# Patient Record
Sex: Female | Born: 1995 | Race: Black or African American | Hispanic: No | Marital: Single | Smoking: Current some day smoker
Health system: Southern US, Community
[De-identification: ages and names within clinical notes are randomized; demographics above are authoritative.]

## PROBLEM LIST (undated history)

## (undated) DIAGNOSIS — G43909 Migraine, unspecified, not intractable, without status migrainosus: Secondary | ICD-10-CM

## (undated) HISTORY — DX: Migraine, unspecified, not intractable, without status migrainosus: G43.909

## (undated) HISTORY — PX: NO PAST SURGERIES: SHX2092

---
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
## 2017-11-19 DIAGNOSIS — J111 Influenza due to unidentified influenza virus with other respiratory manifestations: Secondary | ICD-10-CM | POA: Diagnosis not present

## 2017-12-26 DIAGNOSIS — Z3042 Encounter for surveillance of injectable contraceptive: Secondary | ICD-10-CM | POA: Diagnosis not present

## 2018-02-25 DIAGNOSIS — N390 Urinary tract infection, site not specified: Secondary | ICD-10-CM | POA: Diagnosis not present

## 2018-02-25 DIAGNOSIS — N3000 Acute cystitis without hematuria: Secondary | ICD-10-CM | POA: Diagnosis not present

## 2018-02-25 DIAGNOSIS — J029 Acute pharyngitis, unspecified: Secondary | ICD-10-CM | POA: Diagnosis not present

## 2018-03-17 IMAGING — CT CT ANGIO CHEST
2 of 6 series · 19 of 46 positions shown · IV contrast (isovue)
Comparison: None.

CLINICAL DATA: History of heart murmur, followup, recent
echocardiography, history of migraine

EXAM:
CT ANGIOGRAPHY CHEST WITH CONTRAST
TECHNIQUE: Multidetector CT imaging of the chest was performed using the
standard protocol during bolus administration of intravenous
contrast. Multiplanar CT image reconstructions and MIPs were
obtained to evaluate the vascular anatomy.
CONTRAST:  100 cc Isovue 370

[Series 5: axial arterial · axial · arterial · 0.55mm/px · z∈[-412,-157]mm · 16 of 97 slices shown]
[im 6/97  lung]
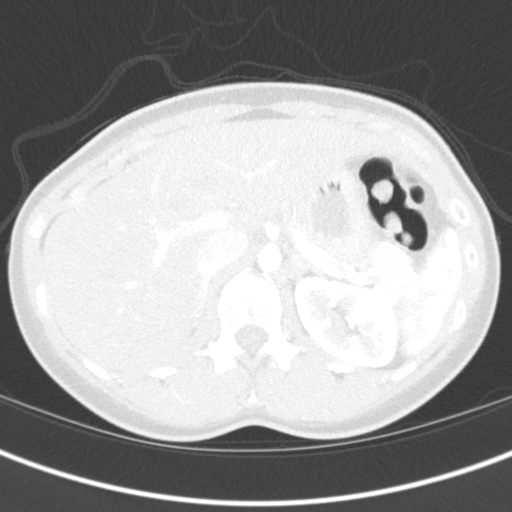
[im 11/97  soft-tissue]
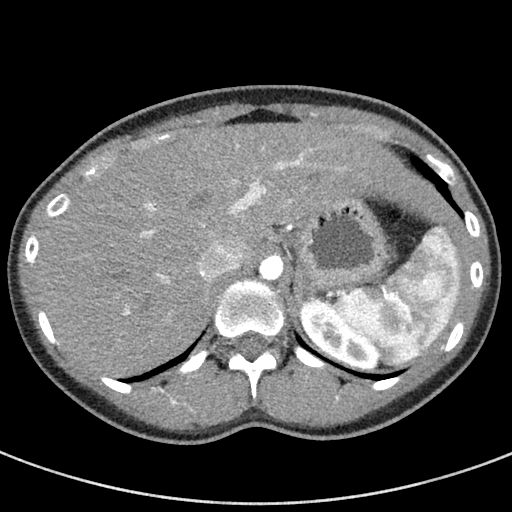
[im 16/97  lung]
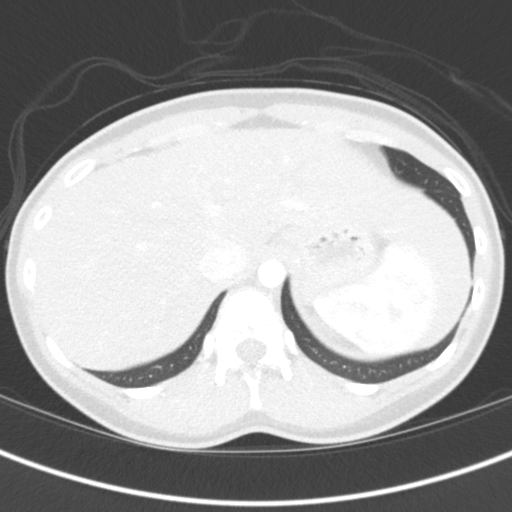
[im 21/97  soft-tissue]
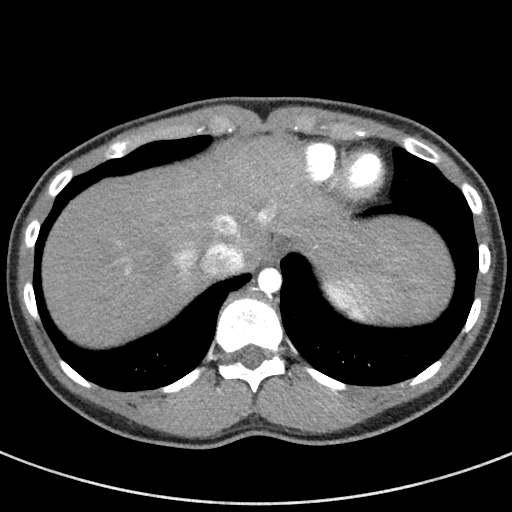
[im 31/97  lung]
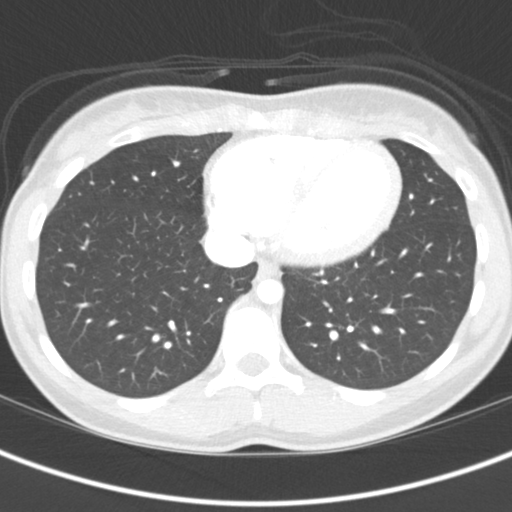
[im 36/97  soft-tissue]
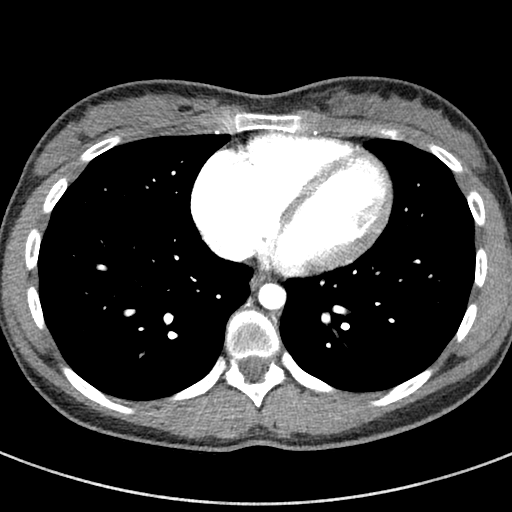
[im 41/97  lung]
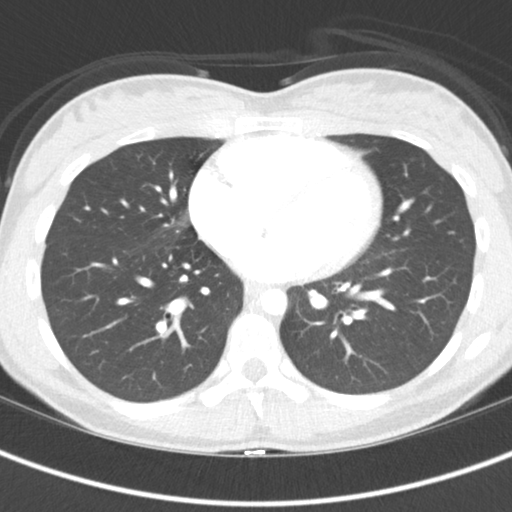
[im 46/97  soft-tissue]
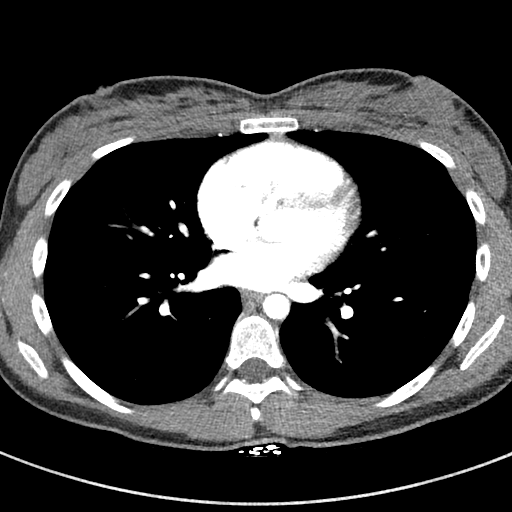
[im 51/97  lung]
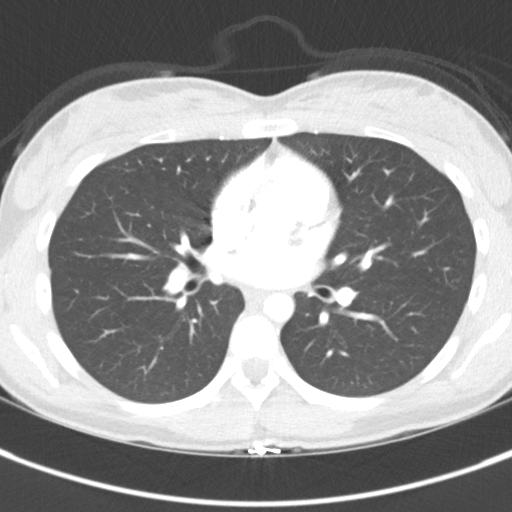
[im 56/97  soft-tissue]
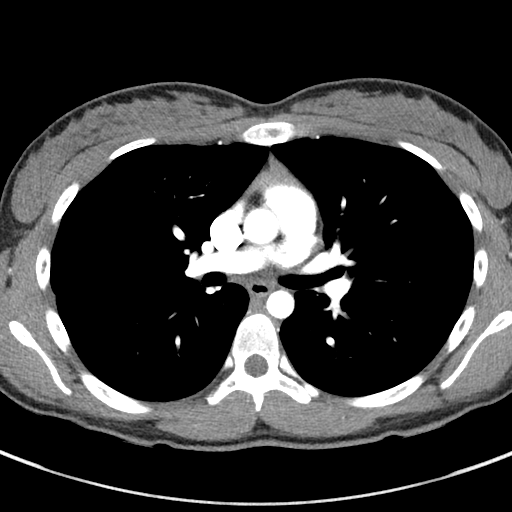
[im 61/97  lung]
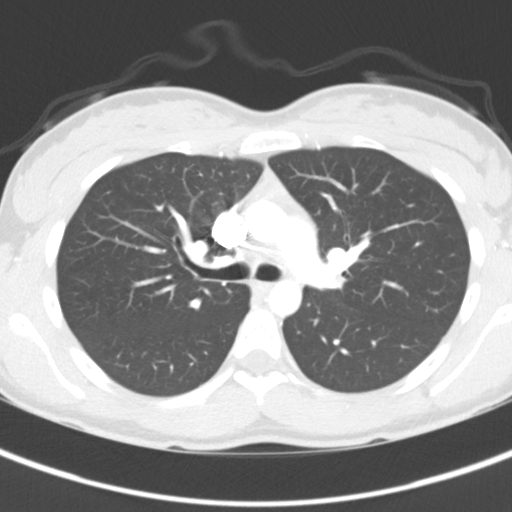
[im 66/97  soft-tissue]
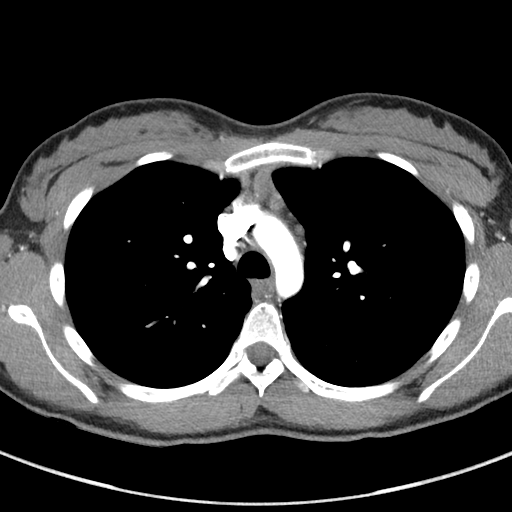
[im 76/97  lung]
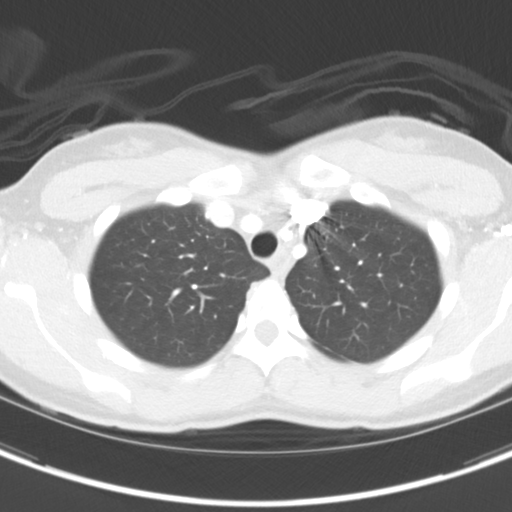
[im 81/97  soft-tissue]
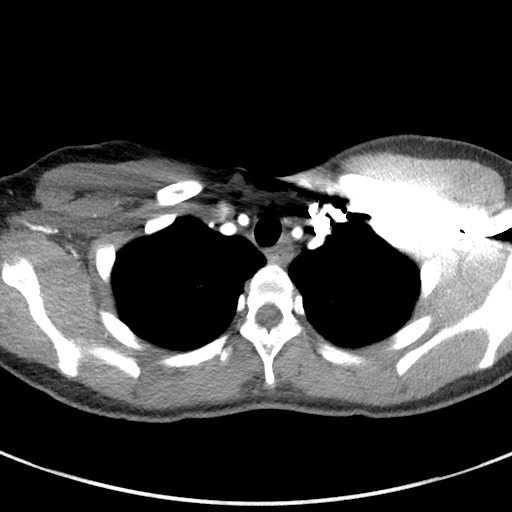
[im 86/97  lung]
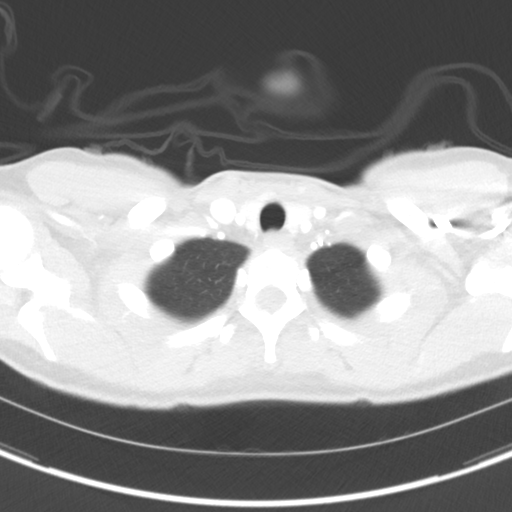
[im 91/97  soft-tissue]
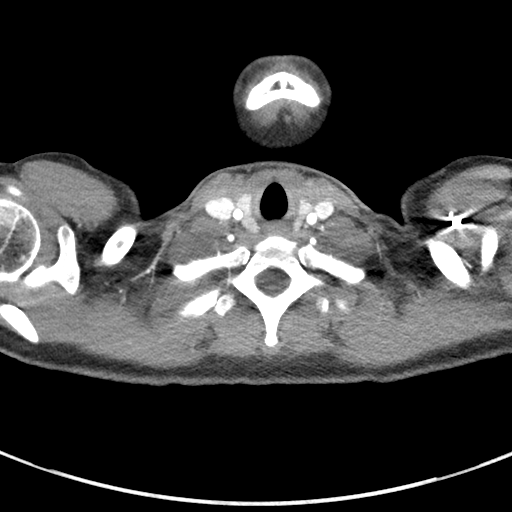

[Series 9: coronals · coronal · 0.60mm/px · 3 of 98 slices shown]
[im 25/98  soft-tissue]
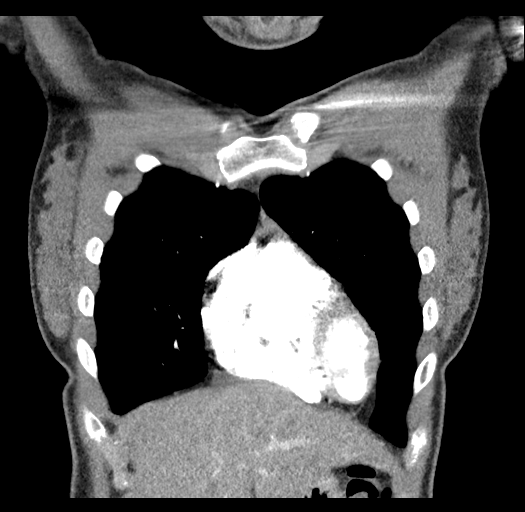
[im 49/98  soft-tissue]
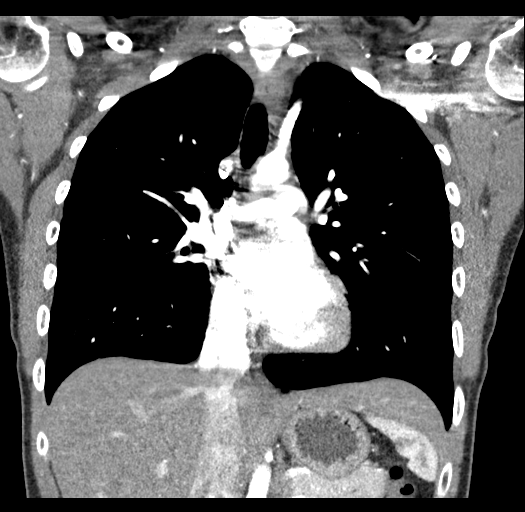
[im 73/98  soft-tissue]
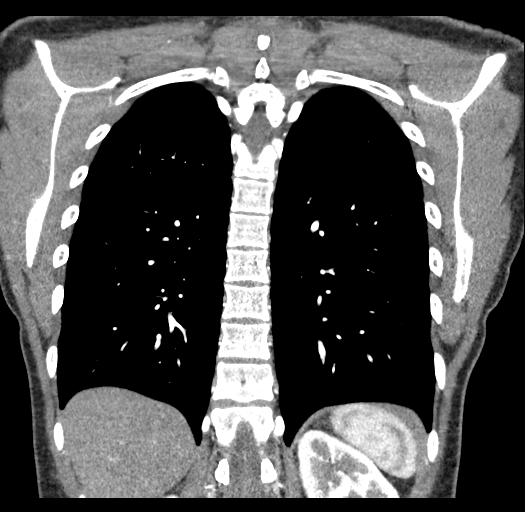

[19 of 46 positions shown; findings below may reference images not displayed]

FINDINGS: Cardiovascular: There is good opacification of the pulmonary
arteries. No definite filling defect to indicate pulmonary embolism
is seen. The thoracic aorta opacifies unfortunately there is some
respiratory and cardiac motion obscuring margins of the ascending
thoracic aorta. The mid ascending thoracic aorta the best
measurement is 19 mm in diameter the heart is within normal limits
in size. No pericardial effusion is seen.

Mediastinum/Nodes: No mediastinal or hilar adenopathy is seen with
only small mediastinal lymph nodes present. The thyroid gland is
unremarkable. No hiatal hernia is seen.

Lungs/Pleura: On lung window images, the lungs appear well aerated.
No parenchymal infiltrate is seen and there is no evidence of
pleural effusion. No lung nodule is seen. The central airway is
patent.

Upper Abdomen: Within the upper abdomen, no abnormality is seen.

Musculoskeletal: The thoracic vertebrae are in normal alignment. The
sternum appears intact.

Review of the MIP images confirms the above findings.
IMPRESSION: 1. Negative CT angiogram of the chest.
2. Unfortunately some respiratory and cardiac motion does obscure
optimal detail of the midthoracic aorta and central pulmonary artery
segments but no obvious abnormality is evident.

## 2018-03-20 DIAGNOSIS — Z3042 Encounter for surveillance of injectable contraceptive: Secondary | ICD-10-CM | POA: Diagnosis not present

## 2018-03-20 DIAGNOSIS — Z113 Encounter for screening for infections with a predominantly sexual mode of transmission: Secondary | ICD-10-CM | POA: Diagnosis not present

## 2018-03-20 DIAGNOSIS — Z01419 Encounter for gynecological examination (general) (routine) without abnormal findings: Secondary | ICD-10-CM | POA: Diagnosis not present

## 2018-03-20 DIAGNOSIS — Z114 Encounter for screening for human immunodeficiency virus [HIV]: Secondary | ICD-10-CM | POA: Diagnosis not present

## 2018-04-04 DIAGNOSIS — A5600 Chlamydial infection of lower genitourinary tract, unspecified: Secondary | ICD-10-CM | POA: Diagnosis not present

## 2018-09-17 DIAGNOSIS — A749 Chlamydial infection, unspecified: Secondary | ICD-10-CM | POA: Diagnosis not present

## 2019-01-08 DIAGNOSIS — Z3009 Encounter for other general counseling and advice on contraception: Secondary | ICD-10-CM | POA: Diagnosis not present

## 2019-01-08 DIAGNOSIS — Z113 Encounter for screening for infections with a predominantly sexual mode of transmission: Secondary | ICD-10-CM | POA: Diagnosis not present

## 2019-01-08 DIAGNOSIS — Z3042 Encounter for surveillance of injectable contraceptive: Secondary | ICD-10-CM | POA: Diagnosis not present

## 2020-02-01 ENCOUNTER — Other Ambulatory Visit: Payer: Self-pay

## 2020-02-01 ENCOUNTER — Encounter: Payer: Self-pay | Admitting: Emergency Medicine

## 2020-02-01 ENCOUNTER — Ambulatory Visit
Admission: EM | Admit: 2020-02-01 | Discharge: 2020-02-01 | Disposition: A | Discharge status: Home / Self Care | Payer: 59 | Attending: Family Medicine | Admitting: Family Medicine

## 2020-02-01 DIAGNOSIS — Z1152 Encounter for screening for COVID-19: Secondary | ICD-10-CM | POA: Diagnosis not present

## 2020-02-01 DIAGNOSIS — Z20822 Contact with and (suspected) exposure to covid-19: Secondary | ICD-10-CM | POA: Diagnosis not present

## 2020-02-01 NOTE — ED Triage Notes (Signed)
Here for covid test only 

## 2020-02-02 LAB — NOVEL CORONAVIRUS, NAA: SARS-CoV-2, NAA: DETECTED — AB

## 2020-02-02 LAB — SARS-COV-2, NAA 2 DAY TAT

## 2021-05-09 DIAGNOSIS — Z01419 Encounter for gynecological examination (general) (routine) without abnormal findings: Secondary | ICD-10-CM | POA: Diagnosis not present

## 2021-05-09 DIAGNOSIS — Z3009 Encounter for other general counseling and advice on contraception: Secondary | ICD-10-CM | POA: Diagnosis not present

## 2021-05-09 DIAGNOSIS — Z114 Encounter for screening for human immunodeficiency virus [HIV]: Secondary | ICD-10-CM | POA: Diagnosis not present

## 2021-05-09 DIAGNOSIS — Z113 Encounter for screening for infections with a predominantly sexual mode of transmission: Secondary | ICD-10-CM | POA: Diagnosis not present

## 2021-12-22 ENCOUNTER — Ambulatory Visit
Admission: RE | Admit: 2021-12-22 | Discharge: 2021-12-22 | Disposition: A | Discharge status: Home / Self Care | Payer: 59 | Source: Ambulatory Visit | Attending: Nurse Practitioner | Admitting: Nurse Practitioner

## 2021-12-22 VITALS — BP 137/74 | HR 70 | Temp 98.6°F | Resp 14

## 2021-12-22 DIAGNOSIS — R45851 Suicidal ideations: Secondary | ICD-10-CM | POA: Diagnosis not present

## 2021-12-22 DIAGNOSIS — Z113 Encounter for screening for infections with a predominantly sexual mode of transmission: Secondary | ICD-10-CM | POA: Diagnosis not present

## 2021-12-22 MED ORDER — METRONIDAZOLE 500 MG PO TABS: 500.0000 mg | ORAL_TABLET | Freq: Two times a day (BID) | ORAL | 0 refills | Status: DC

## 2021-12-22 NOTE — ED Triage Notes (Signed)
Pt presents for STD's test.    Pt reports she think she has anxiety, as she get shaky, sweat hands, panic attacks after her grandfather die in Feb 2023. Reports her mother was diagnosed with anxiety. Pt denies any anxiety at this moment.   Reports in the past months she has thoughts of harm herself or be death. Pt do not have a plan.

## 2021-12-22 NOTE — ED Provider Notes (Signed)
[facility]    CSN: [id number] Arrival date & time: 12/22/21  1542      History   Chief Complaint Chief Complaint  Patient presents with   Appointment    1600   Exposure to STD    HPI Tityana A Perkin is a 26 y.o. female.   The history is provided by the patient.   Patient reports for STI testing.  Patient denies vaginal odor, vaginal itching, and vaginal discharge.  She further denies abdominal pain, or urinary symptoms.  Patient reports 1 female partner in the past 90 days.  She reports a previous history of trichomonas.  Patient states that she is expecting her menstrual cycle, stating that her last period was at the first of last month.    During triage, patient was also noted to have anxiety, and reported suicidal ideations over the past month.  Patient actively denies having a plan at this time.  She states that she also has not having any anxiety symptoms.  Patient reports that her mother has a history of anxiety.  Patient states that she has been experiencing feelings since the loss of her grandfather earlier this year.  She has not been seen or treated by anyone for her symptoms. Past Medical History:  Diagnosis Date   Migraines     There are no problems to display for this patient.   Past Surgical History:  Procedure Laterality Date   NO PAST SURGERIES      OB History   No obstetric history on file.      Home Medications    Prior to Admission medications   Medication Sig Start Date End Date Taking? Authorizing Provider  estradiol cypionate (DEPO-ESTRADIOL) 5 MG/ML injection Inject into the muscle every 28 (twenty-eight) days.    [provider]    Family History Family History  Problem Relation Age of Onset   Asthma Mother    Anxiety disorder Mother    Migraines Brother    Breast cancer Maternal Grandmother     Social History Social History   Tobacco Use   Smoking status: Some Days   Smokeless tobacco: Never  Vaping Use    Vaping Use: Every day  Substance Use Topics   Alcohol use: Yes    Comment: Sometimes   Drug use: Never     Allergies   Patient has no known allergies.   Review of Systems Review of Systems Per HPI   Physical Exam Triage Vital Signs ED Triage Vitals  Enc Vitals Group     BP 12/22/21 1617 137/74     Pulse Rate 12/22/21 1617 70     Resp 12/22/21 1617 14     Temp 12/22/21 1617 98.6 F (37 C)     Temp Source 12/22/21 1617 Oral     SpO2 12/22/21 1617 100 %     Weight --      Height --      Head Circumference --      Peak Flow --      Pain Score 12/22/21 1625 0     Pain Loc --      Pain Edu? --      Excl. in GC? --    No data found.  Updated Vital Signs BP 137/74 (BP Location: Right Arm)   Pulse 70   Temp 98.6 F (37 C) (Oral)   Resp 14   LMP  (Within Weeks) Comment: 4 weeks  SpO2 100%   Visual Acuity Right  Eye Distance:   Left Eye Distance:   Bilateral Distance:    Right Eye Near:   Left Eye Near:    Bilateral Near:     Physical Exam Vitals and nursing note reviewed.  Constitutional:      General: She is not in acute distress.    Appearance: Normal appearance.  HENT:     Head: Normocephalic.  Eyes:     Extraocular Movements: Extraocular movements intact.     Pupils: Pupils are equal, round, and reactive to light.  Cardiovascular:     Rate and Rhythm: Normal rate and regular rhythm.     Pulses: Normal pulses.     Heart sounds: Normal heart sounds.  Pulmonary:     Effort: Pulmonary effort is normal.     Breath sounds: Normal breath sounds.  Abdominal:     General: Bowel sounds are normal.     Palpations: Abdomen is soft.     Tenderness: There is no abdominal tenderness.  Musculoskeletal:     Cervical back: Normal range of motion.  Skin:    General: Skin is warm and dry.  Neurological:     General: No focal deficit present.     Mental Status: She is alert and oriented to person, place, and time.  Psychiatric:        Mood and Affect: Mood  normal.        Behavior: Behavior normal.        Thought Content: Thought content normal.        Judgment: Judgment normal.      UC Treatments / Results  Labs (all labs ordered are listed, but only abnormal results are displayed) Labs Reviewed  HIV ANTIBODY (ROUTINE TESTING W REFLEX)  RPR  CERVICOVAGINAL ANCILLARY ONLY    EKG   Radiology No results found.  Procedures Procedures (including critical care time)  Medications Ordered in UC Medications - No data to display  Initial Impression / Assessment and Plan / UC Course  I have reviewed the triage vital signs and the nursing notes.  Pertinent labs & imaging results that were available during my care of the patient were reviewed by me and considered in my medical decision making (see chart for details).  The patient is well-appearing, she is in no acute distress, vital signs are stable.  Cytology testing is pending at this time, long with HIV/RPR testing.  Patient advised she will be contacted if the pending test results are positive.  For patient's suicidal ideations, she does not have any active ideations at this time, symptoms started over the last month.  She does not have a plan.  Patient feels that she is able to speak with someone if she begins to have suicidal thoughts.  Patient was given information for [facility] behavioral health, and also urged to reach out to 911 or go to local emergency department if she began having symptoms of suicide.  Patient verbalizes understanding.  All questions were answered.  Patient is stable for discharge.   Final Clinical Impressions(s) / UC Diagnoses   Final diagnoses:  Screening examination for sexually transmitted disease  Suicidal ideations     Discharge Instructions      Your cytology and HIV/syphilis results will be available within the next 48 to 72 hours.  If you have access to MyChart, you will also be able to see the results there.  If your results are  positive, you will be contacted and provided treatment. Increase condom use with each  sexual encounter.  As discussed, I am providing you information for [facility].  The address is [address] [place], [place].  You can reach them at [phone] if you begin experiencing suicidal thoughts or need help.  Also you can follow-up in the nearest emergency department or call 911. Follow-up as needed.     ED Prescriptions     Medication Sig Dispense Auth. Provider   metroNIDAZOLE (FLAGYL) 500 MG tablet  (Status: Discontinued) Take 1 tablet (500 mg total) by mouth 2 (two) times daily. 14 tablet Rylin Seavey-Warren, Sadie Haber, NP      PDMP not reviewed this encounter.   Abran Cantor, NP 12/22/21 2031

## 2021-12-22 NOTE — Discharge Instructions (Addendum)
Your cytology and HIV/syphilis results will be available within the next 48 to 72 hours.  If you have access to MyChart, you will also be able to see the results there.  If your results are positive, you will be contacted and provided treatment. Increase condom use with each sexual encounter.  As discussed, I am providing you information for [facility].  The address is [address], [place], [place].  You can reach them at [phone] if you begin experiencing suicidal thoughts or need help.  Also you can follow-up in the nearest emergency department or call 911. Follow-up as needed.

## 2021-12-23 LAB — CERVICOVAGINAL ANCILLARY ONLY
Bacterial Vaginitis (gardnerella): POSITIVE — AB
Candida Glabrata: NEGATIVE
Candida Vaginitis: NEGATIVE
Chlamydia: NEGATIVE
Comment: NEGATIVE
Comment: NEGATIVE
Comment: NEGATIVE
Comment: NEGATIVE
Comment: NEGATIVE
Comment: NORMAL
Neisseria Gonorrhea: NEGATIVE
Trichomonas: NEGATIVE

## 2021-12-23 LAB — RPR: RPR Ser Ql: NONREACTIVE

## 2021-12-23 LAB — HIV ANTIBODY (ROUTINE TESTING W REFLEX): HIV Screen 4th Generation wRfx: NONREACTIVE

## 2022-09-19 DIAGNOSIS — R030 Elevated blood-pressure reading, without diagnosis of hypertension: Secondary | ICD-10-CM | POA: Diagnosis not present

## 2022-09-19 DIAGNOSIS — Z6820 Body mass index (BMI) 20.0-20.9, adult: Secondary | ICD-10-CM | POA: Diagnosis not present

## 2022-09-19 DIAGNOSIS — M7989 Other specified soft tissue disorders: Secondary | ICD-10-CM | POA: Diagnosis not present

## 2022-09-19 DIAGNOSIS — R040 Epistaxis: Secondary | ICD-10-CM | POA: Diagnosis not present

## 2022-09-19 DIAGNOSIS — J019 Acute sinusitis, unspecified: Secondary | ICD-10-CM | POA: Diagnosis not present

## 2023-03-01 DIAGNOSIS — Z23 Encounter for immunization: Secondary | ICD-10-CM | POA: Diagnosis not present

## 2023-03-01 DIAGNOSIS — S61011A Laceration without foreign body of right thumb without damage to nail, initial encounter: Secondary | ICD-10-CM | POA: Diagnosis not present

## 2023-03-01 DIAGNOSIS — W268XXA Contact with other sharp object(s), not elsewhere classified, initial encounter: Secondary | ICD-10-CM | POA: Diagnosis not present

## 2023-03-01 DIAGNOSIS — F17210 Nicotine dependence, cigarettes, uncomplicated: Secondary | ICD-10-CM | POA: Diagnosis not present

## 2023-03-01 NOTE — ED Triage Notes (Signed)
 Pt coming from home.  Pt was cooking for her family last night and cut her right thumb.  Laceration is still bleeding.

## 2023-03-01 NOTE — ED Provider Notes (Signed)
 ------------------------------------------------------------------------------- Attestation signed by Cherie Ardeen Hanger, MD at 03/02/23 [id number] I was the attending physician on duty, and was available in Emergency Department for any consultations. I did not personally care for this patient. The patient was managed by the mid-level provider. I am co-signing the chart as the attending physician.   Signed by Ardeen SHAUNNA Cherie, MD March 02, 2023 at 4:58 AM  -------------------------------------------------------------------------------                                                                                     Emergency Department Provider Note    ED Clinical Impression   Final diagnoses:  Laceration of right thumb without complication, initial encounter (Primary)    ED Assessment/Plan    Condition: Stable Disposition: Discharge  This chart has been completed using Dragon Medical Dictation software, and while attempts have been made to ensure accuracy, certain words and phrases may not be transcribed as intended.   History   Chief Complaint  Patient presents with  . Laceration   HPI  Nicole Erickson is a 28 y.o. female  who presents today to the  emergency department complaining of laceration to radial aspect of distal thumb last night;   requesting tetanus shot.     Allergies: has no known allergies. Medications: is not on any long-term medications. PMHx:  has no past medical history on file. PSHx:  has no past surgical history on file. SocHx:   Allergies, Medications, Medical, Surgical, and Social History were reviewed as documented above.   Social Drivers of Health with Concerns   Food Insecurity: Not on file  Internet Connectivity: Not on file  Housing/Utilities: Not on file  Tobacco Use: High Risk (12/22/2021)   Received from [facility]   Patient History   . Smoking Tobacco Use: Some Days   . Smokeless Tobacco Use: Never   . Passive Exposure: Not  on file  Transportation Needs: Not on file  Alcohol Use: Not on file  Interpersonal Safety: Not on file  Physical Activity: Not on file  Intimate Partner Violence: Not on file  Stress: Not on file  Substance Use: Not on file (03/01/2023)  Social Connections: Not on file  Financial Resource Strain: Not on file  Depression: Not on file  Health Literacy: Not on file     Review Of Systems  Review of Systems  Skin:  Positive for wound.    Physical Exam   BP 150/83   Pulse 71   Temp 36.7 C (98 F) (Temporal)   Resp 20   Ht 165.1 cm (5' 5)   Wt 59.1 kg (130 lb 4.8 oz)   SpO2 100%   BMI 21.68 kg/m   Physical Exam Constitutional:      General: She is not in acute distress.    Appearance: She is not ill-appearing.  HENT:     Head: Normocephalic and atraumatic.  Eyes:     Conjunctiva/sclera: Conjunctivae normal.  Musculoskeletal:     Cervical back: Neck supple.     Comments: Noted chunk of skin missing to right thumb at lateral aspect;  does not involve nail;  superficial;  will not require repair  Skin:    General: Skin is warm.  Neurological:     Mental Status: She is alert and oriented to person, place, and time.  Psychiatric:        Mood and Affect: Mood normal.     ED Course  Medical Decision Making Tetanus updated, no repair required for laceration;  wound cleaned and dressed and pt given home supplies.     Procedures   No results found for this visit on 03/01/23 (from the past 4464 hours).   ED Results No results found for any visits on 03/01/23. No results found.  Medications Administered:  Medications  diph,pertuss(acel),tetanus vaccine-Tdap (BOOSTRIX) injection 0.5 mL (has no administration in time range)    Discharge Medications (Medications Prescribed during this  ED visit and Patient's Home Medications) :    Your Medication List    You have not been prescribed any medications.       Hunter Jeoffrey Murray, [place] 03/01/23 [phone]

## 2023-07-14 ENCOUNTER — Encounter

## 2023-07-14 ENCOUNTER — Encounter (HOSPITAL_COMMUNITY): Payer: Self-pay | Admitting: Obstetrics & Gynecology

## 2023-07-14 DIAGNOSIS — O3680X0 Pregnancy with inconclusive fetal viability, not applicable or unspecified: Secondary | ICD-10-CM | POA: Insufficient documentation

## 2023-07-14 DIAGNOSIS — R109 Unspecified abdominal pain: Secondary | ICD-10-CM | POA: Insufficient documentation

## 2023-07-14 DIAGNOSIS — Z3A01 Less than 8 weeks gestation of pregnancy: Secondary | ICD-10-CM | POA: Insufficient documentation

## 2023-07-14 DIAGNOSIS — O09291 Supervision of pregnancy with other poor reproductive or obstetric history, first trimester: Secondary | ICD-10-CM | POA: Insufficient documentation

## 2023-07-14 DIAGNOSIS — O26891 Other specified pregnancy related conditions, first trimester: Secondary | ICD-10-CM | POA: Insufficient documentation

## 2023-07-14 NOTE — MAU Note (Signed)
 Nicole Erickson is a 28 y.o. at Unknown here in MAU reporting: intermittent sharp abdominal pain. Denies VB or abnormal discharge. Pt reports having a miscarriage a month ago - was seen at a hospital in [place].   LMP: 5/19 Onset of complaint: 2300 Pain score: 7 Vitals:   07/14/23 0141  BP: 106/67  Pulse: 95  Resp: 18  Temp: 99.2 F (37.3 C)  SpO2: 99%     FHT: NA  Lab orders placed from triage: urine pregnancy; UA

## 2023-07-14 NOTE — MAU Provider Note (Signed)
 Chief Complaint: Abdominal Pain   Event Date/Time   First Provider Initiated Contact with Patient 07/14/23 0153        SUBJECTIVE HPI: Nicole Erickson is a 28 y.o. G2P0010 at [redacted]w[redacted]d by LMP who presents to maternity admissions reporting intermittent sharp abdominal pain.  Had a miscarriage a month ago. . She denies vaginal bleeding, vaginal itching/burning, urinary symptoms, h/a, dizziness, n/v, or fever/chills.    HPI RN Note:  Nicole Erickson is a 28 y.o. at Unknown here in MAU reporting: intermittent sharp abdominal pain. Denies VB or abnormal discharge. Pt reports having a miscarriage a month ago - was seen at a hospital in [place].  Onset of complaint: 2300      Past Medical History:  Diagnosis Date   Migraines    Past Surgical History:  Procedure Laterality Date   NO PAST SURGERIES     Social History   Socioeconomic History   Marital status: Single    Spouse name: Not on file   Number of children: Not on file   Years of education: Not on file   Highest education level: Not on file  Occupational History   Not on file  Tobacco Use   Smoking status: Some Days   Smokeless tobacco: Never  Vaping Use   Vaping status: Every Day  Substance and Sexual Activity   Alcohol use: Yes    Comment: Sometimes   Drug use: Never   Sexual activity: Not Currently  Other Topics Concern   Not on file  Social History Narrative   Not on file   Social Drivers of Health   Financial Resource Strain: Not on file  Food Insecurity: Not on file  Transportation Needs: Not on file  Physical Activity: Not on file  Stress: Not on file  Social Connections: Not on file  Intimate Partner Violence: Not on file   No current facility-administered medications on file prior to encounter.   Current Outpatient Medications on File Prior to Encounter  Medication Sig Dispense Refill   estradiol cypionate (DEPO-ESTRADIOL) 5 MG/ML injection Inject into the muscle every 28 (twenty-eight) days.      Allergies  Allergen Reactions   Amoxicillin    Blueberry [Vaccinium Angustifolium]    Latex    Penicillins     I have reviewed patient's Past Medical Hx, Surgical Hx, Family Hx, Social Hx, medications and allergies.   ROS:  Review of Systems Review of Systems  Other systems negative   Physical Exam  Physical Exam Patient Vitals for the past 24 hrs:  BP Temp Temp src Pulse Resp SpO2 Height Weight  07/14/23 0141 106/67 99.2 F (37.3 C) Oral 95 18 99 % 5' 4 (1.626 m) 65.9 kg   Constitutional: Well-developed, well-nourished female in no acute distress.  Cardiovascular: normal rate Respiratory: normal effort GI: Abd soft, non-tender. Pos BS x 4 MS: Extremities nontender, no edema, normal ROM Neurologic: Alert and oriented x 4.  GU: Neg CVAT.  PELVIC EXAM: deferred in lieu of ultrasound  LAB RESULTS Results for orders placed or performed during the hospital encounter of 07/14/23 (from the past 24 hours)  Wet prep, genital     Status: Abnormal   Collection Time: 07/14/23  1:53 AM   Specimen: Vaginal  Result Value Ref Range   Yeast Wet Prep HPF POC NONE SEEN NONE SEEN   Trich, Wet Prep NONE SEEN NONE SEEN   Clue Cells Wet Prep HPF POC PRESENT (A) NONE SEEN   WBC, Wet Prep  HPF POC >=10 (A) <10   Sperm NONE SEEN   CBC     Status: None   Collection Time: 07/14/23  2:52 AM  Result Value Ref Range   WBC 10.0 4.0 - 10.5 K/uL   RBC 4.43 3.87 - 5.11 MIL/uL   Hemoglobin 12.0 12.0 - 15.0 g/dL   HCT 63.9 63.9 - 53.9 %   MCV 81.3 80.0 - 100.0 fL   MCH 27.1 26.0 - 34.0 pg   MCHC 33.3 30.0 - 36.0 g/dL   RDW 86.5 88.4 - 84.4 %   Platelets 263 150 - 400 K/uL   nRBC 0.0 0.0 - 0.2 %  hCG, quantitative, pregnancy     Status: Abnormal   Collection Time: 07/14/23  2:52 AM  Result Value Ref Range   hCG, Beta Chain, Quant, S 15,889 (H) <5 mIU/mL  ABO/Rh     Status: None   Collection Time: 07/14/23  2:52 AM  Result Value Ref Range   ABO/RH(D) A NEG    Antibody Screen       NEG Performed at [facility], [address] [place], [place] [address]        IMAGING US  OB LESS THAN 14 WEEKS WITH OB TRANSVAGINAL Result Date: 07/14/2023 CLINICAL DATA:  Initial evaluation for early pregnancy, vaginal bleeding. EXAM: OBSTETRIC <14 WK US  AND TRANSVAGINAL OB US  TECHNIQUE: Both transabdominal and transvaginal ultrasound examinations were performed for complete evaluation of the gestation as well as the maternal uterus, adnexal regions, and pelvic cul-de-sac. Transvaginal technique was performed to assess early pregnancy. COMPARISON:  None Available. FINDINGS: Intrauterine gestational sac: Single Yolk sac:  Present Embryo:  Present Cardiac Activity: Negative Heart Rate: N/A CRL:  2.7 mm   5 w   6 d                  US  EDC: 03/09/2024 Subchorionic hemorrhage:  None visualized. Maternal uterus/adnexae: Ovaries within normal limits. No adnexal mass. Trace free fluid present within the pelvis. IMPRESSION: 1. Single IUP with internal embryo and yolk sac, crown-rump length measures 2.7 mm, but no detectable cardiac activity. Findings are suspicious but not yet definitive for failed pregnancy. Recommend follow-up US  in 10-14 days for definitive diagnosis. This recommendation follows SRU consensus guidelines: Diagnostic Criteria for Nonviable Pregnancy Early in the First Trimester. LOISE Nicole Erickson Med 2013; 630:8556-48. 2. Trace free fluid within the pelvis, nonspecific, but most commonly physiologic. Electronically Signed   By: Morene Hoard M.D.   On: 07/14/2023 02:48     MAU Management/MDM: I have reviewed the triage vital signs and the nursing notes.   Pertinent labs & imaging results that were available during my care of the patient were reviewed by me and considered in my medical decision making (see chart for details).      I have reviewed her medical records including past results, notes and treatments. Medical, Surgical, and family history were reviewed.  Medications and  recent lab tests were reviewed  Ordered usual first trimester r/o ectopic labs.   Pelvic cultures done Will check baseline Ultrasound to rule out ectopic.  This bleeding/pain can represent a normal pregnancy with bleeding, spontaneous abortion or even an ectopic which can be life-threatening.  The process as listed above helps to determine which of these is present. Discussed findings and recommendaton for repeat US    Pt will call her provider to arrange (provider is in [place])  She is driving back there tonight   ASSESSMENT Single IUP  at [redacted]w[redacted]d Bleeding and cramping Pregnancy of unknown location  PLAN Discharge home Plan to repeat Ultrasound in about 10-14 days    Pt stable at time of discharge. Encouraged to return here if she develops worsening of symptoms, increase in pain, fever, or other concerning symptoms.    Earnie Pouch CNM, MSN Certified Nurse-Midwife 07/14/2023  1:53 AM
